# Patient Record
Sex: Female | Born: 1980 | Race: Black or African American | Hispanic: No | Marital: Single | Smoking: Former smoker
Health system: Southern US, Community
[De-identification: ages and names within clinical notes are randomized; demographics above are authoritative.]

## PROBLEM LIST (undated history)

## (undated) DIAGNOSIS — J302 Other seasonal allergic rhinitis: Secondary | ICD-10-CM

## (undated) DIAGNOSIS — Z8709 Personal history of other diseases of the respiratory system: Secondary | ICD-10-CM

## (undated) HISTORY — PX: SHOULDER SURGERY: SHX246

## (undated) HISTORY — DX: Other seasonal allergic rhinitis: J30.2

## (undated) HISTORY — DX: Personal history of other diseases of the respiratory system: Z87.09

---
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
## 1999-09-07 ENCOUNTER — Emergency Department (HOSPITAL_COMMUNITY): Admission: EM | Admit: 1999-09-07 | Discharge: 1999-09-07 | Payer: Self-pay | Admitting: Emergency Medicine

## 2002-07-17 ENCOUNTER — Emergency Department (HOSPITAL_COMMUNITY): Admission: EM | Admit: 2002-07-17 | Discharge: 2002-07-17 | Payer: Self-pay | Admitting: Emergency Medicine

## 2002-08-17 ENCOUNTER — Inpatient Hospital Stay (HOSPITAL_COMMUNITY): Admission: AD | Admit: 2002-08-17 | Discharge: 2002-08-17 | Payer: Self-pay | Admitting: *Deleted

## 2003-05-16 ENCOUNTER — Emergency Department (HOSPITAL_COMMUNITY): Admission: EM | Admit: 2003-05-16 | Discharge: 2003-05-16 | Payer: Self-pay | Admitting: Emergency Medicine

## 2008-05-05 ENCOUNTER — Emergency Department (HOSPITAL_COMMUNITY): Admission: EM | Admit: 2008-05-05 | Discharge: 2008-05-05 | Payer: Self-pay | Admitting: Family Medicine

## 2008-05-06 ENCOUNTER — Emergency Department (HOSPITAL_COMMUNITY): Admission: EM | Admit: 2008-05-06 | Discharge: 2008-05-06 | Payer: Self-pay | Admitting: Emergency Medicine

## 2008-05-07 ENCOUNTER — Emergency Department (HOSPITAL_COMMUNITY): Admission: EM | Admit: 2008-05-07 | Discharge: 2008-05-07 | Payer: Self-pay | Admitting: Emergency Medicine

## 2008-10-31 ENCOUNTER — Emergency Department (HOSPITAL_COMMUNITY): Admission: EM | Admit: 2008-10-31 | Discharge: 2008-10-31 | Payer: Self-pay | Admitting: Emergency Medicine

## 2011-05-06 ENCOUNTER — Emergency Department (HOSPITAL_COMMUNITY)
Admission: EM | Admit: 2011-05-06 | Discharge: 2011-05-06 | Disposition: A | Discharge status: Home / Self Care | Payer: Medicaid Other | Attending: Emergency Medicine | Admitting: Emergency Medicine

## 2011-05-06 DIAGNOSIS — J069 Acute upper respiratory infection, unspecified: Secondary | ICD-10-CM | POA: Insufficient documentation

## 2011-05-06 DIAGNOSIS — F17200 Nicotine dependence, unspecified, uncomplicated: Secondary | ICD-10-CM | POA: Insufficient documentation

## 2011-05-06 DIAGNOSIS — R059 Cough, unspecified: Secondary | ICD-10-CM | POA: Insufficient documentation

## 2011-05-06 DIAGNOSIS — IMO0001 Reserved for inherently not codable concepts without codable children: Secondary | ICD-10-CM | POA: Insufficient documentation

## 2011-05-06 DIAGNOSIS — R05 Cough: Secondary | ICD-10-CM | POA: Insufficient documentation

## 2011-06-01 LAB — POCT URINALYSIS DIP (DEVICE)
Glucose, UA: NEGATIVE
Operator id: 208841
Specific Gravity, Urine: >=1.03
Urobilinogen, UA: 4 — ABNORMAL HIGH

## 2011-06-01 LAB — URINALYSIS, MICROSCOPIC ONLY
Glucose, UA: NEGATIVE
Protein, ur: 100 — AB
Specific Gravity, Urine: 1.039 — ABNORMAL HIGH
Urobilinogen, UA: 2 — ABNORMAL HIGH

## 2011-06-01 LAB — GC/CHLAMYDIA PROBE AMP, GENITAL
Chlamydia, DNA Probe: NEGATIVE
GC Probe Amp, Genital: NEGATIVE

## 2011-06-01 LAB — URINE CULTURE: Colony Count: 40000

## 2011-06-01 LAB — HERPES SIMPLEX VIRUS CULTURE

## 2012-04-20 ENCOUNTER — Emergency Department (HOSPITAL_COMMUNITY)
Admission: EM | Admit: 2012-04-20 | Discharge: 2012-04-20 | Disposition: A | Discharge status: Home / Self Care | Payer: Managed Care, Other (non HMO) | Attending: Emergency Medicine | Admitting: Emergency Medicine

## 2012-04-20 ENCOUNTER — Emergency Department (HOSPITAL_COMMUNITY): Payer: Managed Care, Other (non HMO)

## 2012-04-20 ENCOUNTER — Encounter (HOSPITAL_COMMUNITY): Payer: Self-pay | Admitting: Emergency Medicine

## 2012-04-20 DIAGNOSIS — R0781 Pleurodynia: Secondary | ICD-10-CM

## 2012-04-20 DIAGNOSIS — F17200 Nicotine dependence, unspecified, uncomplicated: Secondary | ICD-10-CM | POA: Insufficient documentation

## 2012-04-20 DIAGNOSIS — R091 Pleurisy: Secondary | ICD-10-CM | POA: Insufficient documentation

## 2012-04-20 MED ORDER — NAPROXEN 500 MG PO TABS: 500.0000 mg | ORAL_TABLET | Freq: Two times a day (BID) | ORAL | Status: DC

## 2012-04-20 NOTE — ED Provider Notes (Signed)
Medical screening examination/treatment/procedure(s) were performed by non-physician practitioner and as supervising physician I was immediately available for consultation/collaboration.   Kimbely Whiteaker M Theophile Harvie, MD 04/20/12 2148 

## 2012-04-20 NOTE — ED Provider Notes (Signed)
History     CSN: [id number]  Arrival date & time 04/20/12  1159   None     Chief Complaint  Patient presents with  . Pleurisy    (Consider location/radiation/quality/duration/timing/severity/associated sxs/prior treatment) The history is provided by the patient and medical records.   Cassandra Mcmillan is a 31 y.o. female presents to the emergency department complaining of left-sided chest pain.  The onset of the symptoms was  gradual starting 3 days ago.  The patient has associated cough.  The symptoms have been  persistent, gradually worsened.  It may, coughing, deep inspiration makes the symptoms worse and nothing makes symptoms better.  The patient denies nausea, vomiting, diaphoresis, shortness of breath, syncope, near syncope.  She states she was sick approximately 2 weeks ago with a cold, cough, congestion which persisted for greater than one week. 3 days ago she began to have pain in the lower left ribs.  She denies trauma, injury.  She states she is coughing up clear phlegm.  The pain increases with cough, deep inspiration, movement, palpation.    She has no family history of early cardiac disease or sudden death.  She is a smoker.  He also works in a Development worker, community.  She admits that there is some chance she could have pulled a muscle.  Pain is described as sharp, stabbing, burning, rated at a 2/10 and does not radiate.  It is able to be localized/pinpointed with one finger.  It is most intensely caused by coughing and deep breathing.   The patient has medical history significant for: History reviewed. No pertinent past medical history.   History reviewed. No pertinent past medical history.  History reviewed. No pertinent past surgical history.  History reviewed. No pertinent family history.  History  Substance Use Topics  . Smoking status: Current Everyday Smoker -- 0.5 packs/day  . Smokeless tobacco: Not on file  . Alcohol Use: No    OB History    Grav Para  Term Preterm Abortions TAB SAB Ect Mult Living                  Review of Systems  Constitutional: Negative for fever, activity change and fatigue.  HENT: Negative for sore throat, neck pain and neck stiffness.   Respiratory: Positive for cough. Negative for choking, chest tightness, shortness of breath and wheezing.   Cardiovascular: Positive for chest pain. Negative for palpitations and leg swelling.  Gastrointestinal: Negative for nausea, vomiting, abdominal pain, diarrhea and constipation.  Skin: Negative for rash.  Neurological: Negative for light-headedness and headaches.    Allergies  Review of patient's allergies indicates no known allergies.  Home Medications  No current outpatient prescriptions on file.  BP 105/65  Pulse 68  Temp 98.1 F (36.7 C) (Oral)  Resp 20  Ht 5\' 2"  (1.575 m)  Wt 125 lb (56.7 kg)  BMI 22.86 kg/m2  SpO2 100%  LMP 04/03/2012  Physical Exam  Nursing note and vitals reviewed. Constitutional: She appears well-developed and well-nourished. No distress.  HENT:  Head: Normocephalic and atraumatic.  Mouth/Throat: Oropharynx is clear and moist. No oropharyngeal exudate.  Eyes: Conjunctivae are normal. No scleral icterus.  Neck: Normal range of motion. Neck supple.  Cardiovascular: Normal rate, regular rhythm, normal heart sounds and intact distal pulses.  Exam reveals no gallop and no friction rub.   No murmur heard. Pulmonary/Chest: Effort normal and breath sounds normal. No respiratory distress. She has no wheezes. She has no rales. She  exhibits tenderness (with palpation).  Abdominal: Soft. Bowel sounds are normal. She exhibits no mass. There is no tenderness. There is no rebound and no guarding.  Musculoskeletal: Normal range of motion. She exhibits tenderness (to the anterior chest wall.). She exhibits no edema.       Pain located in the anterior chest around the left lower sternal border and the ninth rib.      Lymphadenopathy:    She has  no cervical adenopathy.  Neurological: She is alert. She exhibits normal muscle tone. Coordination normal.       Speech is clear and goal oriented Moves extremities without ataxia  Skin: Skin is warm and dry. No rash noted. She is not diaphoretic. No erythema.       There is no ecchymosis or erythema.  There is no rash or broken skin.    Psychiatric: She has a normal mood and affect.    ED Course  Procedures (including critical care time)  Labs Reviewed - No data to display Dg Chest 2 View  04/20/2012  *RADIOLOGY REPORT*  Clinical Data: Cough with left rib pain.  No known injury.  CHEST - 2 VIEW  Comparison: None.  Findings: The heart size and mediastinal contours are normal. The lungs are clear. There is no pleural effusion or pneumothorax. No acute osseous findings are identified.  IMPRESSION: No active cardiopulmonary process.  No rib abnormalities identified.   Original Report Authenticated By: Gerrianne Scale, M.D.      1. Pleurisy without effusion   2. Pleuritic chest pain     MDM  [name] presents with left-sided chest pain/rib pain.  She is low risk for PE based on the Wells criteria.  She has PERC negative.  She is young, healthy, alert and oriented, interactive, nontoxic-appearing.  Pain changes with movement and deep inspiration.   Based on history and physical exam I believe this is pleurisy secondary to a viral infection a few weeks ago.  Her chest x-ray is negative for active cardiopulmonary diseases or rib abnormalities, there is no effusion present.  She's had no trauma to suggest rib fracture. She does not have shortness of breath and I do not believe that this is a pulmonary embolism or acute coronary syndrome.  Discussed this with the patient along with treatment with NSAIDs.  I have also discussed reasons to return immediately to the ER.  Patient states understanding.    1. Medications: Ibuprofen 2. Treatment: Rest, warm compress if that helps, Take ibuprofen  as needed  3. Follow Up: Primary care or emergency department if symptoms persist. Return immediately to the emergency department if you develop shortness of breath associated with chest pain, nausea, lightheadedness or other symptoms.        Dahlia Client Josmar Messimer, PA-C 04/20/12 1435

## 2012-04-20 NOTE — ED Notes (Signed)
Patient states she had a cold for about a week and a half and for the last three days it hurts (mid-substernally) when she coughs or takes a deep breath

## 2013-02-16 ENCOUNTER — Encounter (HOSPITAL_COMMUNITY): Payer: Self-pay | Admitting: Emergency Medicine

## 2013-02-16 ENCOUNTER — Emergency Department (HOSPITAL_COMMUNITY)
Admission: EM | Admit: 2013-02-16 | Discharge: 2013-02-16 | Disposition: A | Discharge status: Home / Self Care | Payer: Managed Care, Other (non HMO) | Attending: Emergency Medicine | Admitting: Emergency Medicine

## 2013-02-16 DIAGNOSIS — J020 Streptococcal pharyngitis: Secondary | ICD-10-CM

## 2013-02-16 DIAGNOSIS — F17200 Nicotine dependence, unspecified, uncomplicated: Secondary | ICD-10-CM | POA: Insufficient documentation

## 2013-02-16 LAB — RAPID STREP SCREEN (MED CTR MEBANE ONLY): Streptococcus, Group A Screen (Direct): NEGATIVE

## 2013-02-16 MED ORDER — PENICILLIN G BENZATHINE 1200000 UNIT/2ML IM SUSP
1.2000 10*6.[IU] | Freq: Once | INTRAMUSCULAR | Status: AC
  Administered 2013-02-16: 1.2 10*6.[IU] via INTRAMUSCULAR
  Filled 2013-02-16: qty 2

## 2013-02-16 NOTE — ED Provider Notes (Signed)
History     CSN: [id number]  Arrival date & time 02/16/13  1023   First MD Initiated Contact with Patient 02/16/13 1056      Chief Complaint  Patient presents with  . Sore Throat    (Consider location/radiation/quality/duration/timing/severity/associated sxs/prior treatment) HPI Comments: Patient is a 32 year old female who presents with a 3 day history of sore throat. Patient reports gradual onset and progressively worsening sharp, severe throat pain. The pain is constant and made worse with swallowing. The pain is localized to the patient's throat and equal on both sides. Nothing alleviates the pain. The patient has not tried anything for symptom relief. Patient reports associated cervical adenopathy. Patient denies fever, cough, headache, visual changes, sinus congestion, difficulty breathing, chest pain, SOB, abdominal pain, NVD.     Patient is a 32 y.o. female presenting with pharyngitis.  Sore Throat Associated symptoms include a sore throat.    History reviewed. No pertinent past medical history.  History reviewed. No pertinent past surgical history.  No family history on file.  History  Substance Use Topics  . Smoking status: Current Every Day Smoker -- 0.50 packs/day  . Smokeless tobacco: Not on file  . Alcohol Use: No    OB History   Grav Para Term Preterm Abortions TAB SAB Ect Mult Living                  Review of Systems  HENT: Positive for sore throat.   All other systems reviewed and are negative.    Allergies  Review of patient's allergies indicates no known allergies.  Home Medications  No current outpatient prescriptions on file.  BP 100/65  Pulse 88  Temp(Src) 99.1 F (37.3 C) (Oral)  Resp 20  SpO2 100%  LMP 02/08/2013  Physical Exam  Nursing note and vitals reviewed. Constitutional: She appears well-developed and well-nourished. No distress.  HENT:  Head: Normocephalic and atraumatic.  Mouth/Throat: Oropharyngeal exudate present.   Bilateral tonsillar erythema and exudate noted. No uvula deviation.   Eyes: Conjunctivae and EOM are normal.  Neck: Normal range of motion.  Cardiovascular: Normal rate and regular rhythm.  Exam reveals no gallop and no friction rub.   No murmur heard. Pulmonary/Chest: Effort normal and breath sounds normal. She has no wheezes. She has no rales. She exhibits no tenderness.  Abdominal: Soft. There is no tenderness.  Musculoskeletal: Normal range of motion.  Lymphadenopathy:    She has cervical adenopathy.  Neurological: She is alert.  Speech is goal-oriented. Moves limbs without ataxia.   Skin: Skin is warm and dry.  Psychiatric: She has a normal mood and affect. Her behavior is normal.    ED Course  Procedures (including critical care time)  Labs Reviewed  RAPID STREP SCREEN  CULTURE, GROUP A STREP   No results found.   1. Strep pharyngitis       MDM  11:13 AM Rapid strep pending. Vitals stable and patient afebrile.   12:52 PM Rapid strep negative but patient will be treated based on clinical impression. Patient will have IM pen G. Vitals stable and patient afebrile. Patient will be discharged without further evaluation. Patient instructed to return with worsening or concerning symptoms.        Cassandra Beck, PA-C 02/16/13 1312

## 2013-02-16 NOTE — ED Provider Notes (Signed)
Medical screening examination/treatment/procedure(s) were performed by non-physician practitioner and as supervising physician I was immediately available for consultation/collaboration.  Juliet Rude. Rubin Payor, MD 02/16/13 1549

## 2013-02-16 NOTE — ED Notes (Signed)
Pt from home reports that she has had sore throat and sore neck glands x3 days. Pt denies fever, body aches, cough. Pt adds that she ate and drank after someone that was dx with Mono. Pt is A&Ox4 and in NAD.

## 2013-02-18 LAB — CULTURE, GROUP A STREP

## 2013-02-19 ENCOUNTER — Emergency Department (HOSPITAL_COMMUNITY)
Admission: EM | Admit: 2013-02-19 | Discharge: 2013-02-19 | Disposition: A | Discharge status: Home / Self Care | Payer: Managed Care, Other (non HMO) | Attending: Emergency Medicine | Admitting: Emergency Medicine

## 2013-02-19 ENCOUNTER — Encounter (HOSPITAL_COMMUNITY): Payer: Self-pay | Admitting: Emergency Medicine

## 2013-02-19 DIAGNOSIS — R110 Nausea: Secondary | ICD-10-CM | POA: Insufficient documentation

## 2013-02-19 DIAGNOSIS — R599 Enlarged lymph nodes, unspecified: Secondary | ICD-10-CM | POA: Insufficient documentation

## 2013-02-19 DIAGNOSIS — F17200 Nicotine dependence, unspecified, uncomplicated: Secondary | ICD-10-CM | POA: Insufficient documentation

## 2013-02-19 DIAGNOSIS — R51 Headache: Secondary | ICD-10-CM | POA: Insufficient documentation

## 2013-02-19 DIAGNOSIS — J029 Acute pharyngitis, unspecified: Secondary | ICD-10-CM

## 2013-02-19 DIAGNOSIS — M542 Cervicalgia: Secondary | ICD-10-CM | POA: Insufficient documentation

## 2013-02-19 MED ORDER — KETOROLAC TROMETHAMINE 60 MG/2ML IM SOLN
60.0000 mg | Freq: Once | INTRAMUSCULAR | Status: AC
  Administered 2013-02-19: 60 mg via INTRAMUSCULAR
  Filled 2013-02-19: qty 2

## 2013-02-19 MED ORDER — DEXAMETHASONE SODIUM PHOSPHATE 10 MG/ML IJ SOLN
10.0000 mg | Freq: Once | INTRAMUSCULAR | Status: AC
  Administered 2013-02-19: 10 mg via INTRAMUSCULAR
  Filled 2013-02-19: qty 1

## 2013-02-19 MED ORDER — OXYCODONE-ACETAMINOPHEN 5-325 MG/5ML PO SOLN: 5.0000 mL | ORAL | Status: DC | PRN

## 2013-02-19 NOTE — ED Provider Notes (Signed)
History  This chart was scribed for non-physician practitioner working with Flint Melter, MD by Greggory Stallion, ED scribe. This patient was seen in room WTR5/WTR5 and the patient's care was started at 3:02 PM.  CSN: [id number] Arrival date & time 02/19/13  1421    Chief Complaint  Patient presents with  . Sore Throat  . Headache  . Neck Pain    The history is provided by the patient. No language interpreter was used.    HPI Comments: Cassandra Mcmillan is a 32 y.o. female who presents to the Emergency Department complaining of gradual onset, gradually worsening sore throat that started 5 days ago. She states she has associated neck pain and migraine that started two days ago. Pt states there is increased pain with swallowing. Pt states she was here Sunday for the sore throat and was given a shot of penicillin with no relief. Pt states she was exposed to mono about one week ago. She states she has occasional nausea. She states she has had a slight fever of 99. Pt states she has taken aspirin with no relief. She states she has trouble breathing at night. Pt denies emesis.   No past medical history on file. No past surgical history on file. No family history on file. History  Substance Use Topics  . Smoking status: Current Every Day Smoker -- 0.50 packs/day  . Smokeless tobacco: Not on file  . Alcohol Use: No   OB History   Grav Para Term Preterm Abortions TAB SAB Ect Mult Living                 Review of Systems  HENT: Positive for sore throat and neck pain.   Gastrointestinal: Positive for nausea.  Neurological: Positive for headaches.  All other systems reviewed and are negative.    Allergies  Review of patient's allergies indicates no known allergies.  Home Medications   Current Outpatient Rx  Name  Route  Sig  Dispense  Refill  . OVER THE COUNTER MEDICATION   Oral   Take 1 tablet by mouth 2 (two) times daily as needed (for allergy symptoms). OTC Allergy Relief  pill.          BP 108/71  Pulse 100  Temp(Src) 99.2 F (37.3 C) (Oral)  Resp 14  Ht 5\' 2"  (1.575 m)  Wt 130 lb (58.968 kg)  BMI 23.77 kg/m2  SpO2 100%  LMP 02/08/2013  Physical Exam  Nursing note and vitals reviewed. Constitutional: She is oriented to person, place, and time. She appears well-developed and well-nourished. No distress.  HENT:  Head: Normocephalic and atraumatic. No trismus in the jaw.  Right Ear: External ear normal.  Left Ear: External ear normal.  Nose: Nose normal.  Mouth/Throat: Uvula is midline, oropharynx is clear and moist and mucous membranes are normal.  Bilaterally tonsillar enlargement with exudate. No submental edema, tongue elevation, or trismus.  Eyes: Conjunctivae are normal.  Neck: Normal range of motion.  Cardiovascular: Normal rate, regular rhythm and normal heart sounds.   Pulmonary/Chest: Effort normal and breath sounds normal. No stridor. No respiratory distress. She has no wheezes. She has no rales.  Abdominal: Soft. She exhibits no distension. There is no hepatosplenomegaly. There is no tenderness.  Musculoskeletal: Normal range of motion.  Lymphadenopathy:    She has cervical adenopathy.       Right cervical: Superficial cervical adenopathy present.       Left cervical: Superficial cervical adenopathy present.  Neurological: She  is alert and oriented to person, place, and time. She has normal strength.  Skin: Skin is warm and dry. She is not diaphoretic. No erythema.  Psychiatric: She has a normal mood and affect. Her behavior is normal.    ED Course  Procedures (including critical care time)  DIAGNOSTIC STUDIES: Oxygen Saturation is 100% on RA, normal by my interpretation.    COORDINATION OF CARE: 3:31 PM-Discussed treatment plan with pt at bedside and pt agreed to plan.   Labs Reviewed - No data to display  No results found.  1. Pharyngitis     MDM  Patient presents today with worsening pharyngitis. Treated with  Bicillin after negative strep test 2 days ago. No improvement after bicillin and asa. Tonsils enlarged bilaterally, no uvula deviation, trismus, submental edema, tongue elevation. No concern for spread of infection to soft tissue. Discussed possibility of mono and reason to come back to ED. She was given toradol and decadron in ED today. Vital signs stable for discharge. Patient / Family / Caregiver informed of clinical course, understand medical decision-making process, and agree with plan.   Medications  dexamethasone (DECADRON) injection 10 mg (10 mg Intramuscular Given 02/19/13 1549)  ketorolac (TORADOL) injection 60 mg (60 mg Intramuscular Given 02/19/13 1549)         I personally performed the services described in this documentation, which was scribed in my presence. The recorded information has been reviewed and is accurate.    Mora Bellman, PA-C 02/19/13 1645

## 2013-02-19 NOTE — ED Notes (Signed)
Pt reports sore throat x 5 days, increased pain when swallowing. C/o neck pain x2 days. Migraine x 2 days. Denies vomiting. Occasional. nausea

## 2013-02-20 NOTE — ED Provider Notes (Signed)
Medical screening examination/treatment/procedure(s) were performed by non-physician practitioner and as supervising physician I was immediately available for consultation/collaboration.  Flint Melter, MD 02/20/13 1327

## 2013-05-17 ENCOUNTER — Emergency Department (HOSPITAL_COMMUNITY)
Admission: EM | Admit: 2013-05-17 | Discharge: 2013-05-17 | Disposition: A | Discharge status: Home / Self Care | Payer: Managed Care, Other (non HMO) | Attending: Emergency Medicine | Admitting: Emergency Medicine

## 2013-05-17 ENCOUNTER — Emergency Department (HOSPITAL_COMMUNITY): Payer: Managed Care, Other (non HMO)

## 2013-05-17 ENCOUNTER — Encounter (HOSPITAL_COMMUNITY): Payer: Self-pay | Admitting: *Deleted

## 2013-05-17 DIAGNOSIS — J40 Bronchitis, not specified as acute or chronic: Secondary | ICD-10-CM | POA: Insufficient documentation

## 2013-05-17 DIAGNOSIS — F17200 Nicotine dependence, unspecified, uncomplicated: Secondary | ICD-10-CM | POA: Insufficient documentation

## 2013-05-17 DIAGNOSIS — J3489 Other specified disorders of nose and nasal sinuses: Secondary | ICD-10-CM | POA: Insufficient documentation

## 2013-05-17 MED ORDER — ALBUTEROL SULFATE HFA 108 (90 BASE) MCG/ACT IN AERS
1.0000 | INHALATION_SPRAY | RESPIRATORY_TRACT | Status: DC | PRN
  Administered 2013-05-17: 2 via RESPIRATORY_TRACT
  Filled 2013-05-17: qty 13.4

## 2013-05-17 NOTE — ED Notes (Signed)
Cough/congestion/chest tightness/itchy throat; x 3 days

## 2013-05-17 NOTE — ED Provider Notes (Signed)
CSN: [id number]     Arrival date & time 05/17/13  7829 History   First MD Initiated Contact with Patient 05/17/13 0703     Chief Complaint  Patient presents with  . Cough   (Consider location/radiation/quality/duration/timing/severity/associated sxs/prior Treatment) Patient is a 32 y.o. female presenting with cough. The history is provided by the patient.  Cough Cough characteristics:  Productive Sputum characteristics:  Yellow Severity:  Mild Onset quality:  Gradual Duration:  3 days Timing:  Constant Progression:  Unchanged Chronicity:  New Smoker: yes   Context: upper respiratory infection   Relieved by:  Nothing Worsened by:  Nothing tried Ineffective treatments:  Rest Associated symptoms: chest pain, rhinorrhea, shortness of breath, sinus congestion and wheezing   Associated symptoms: no fever and no sore throat   Associated symptoms comment:  Chest tightness worse at night Risk factors: no recent infection and no recent travel     History reviewed. No pertinent past medical history. History reviewed. No pertinent past surgical history. No family history on file. History  Substance Use Topics  . Smoking status: Current Every Day Smoker -- 0.50 packs/day  . Smokeless tobacco: Not on file  . Alcohol Use: No   OB History   Grav Para Term Preterm Abortions TAB SAB Ect Mult Living                 Review of Systems  Constitutional: Negative for fever.  HENT: Positive for rhinorrhea. Negative for sore throat.   Respiratory: Positive for cough, shortness of breath and wheezing.   Cardiovascular: Positive for chest pain.  All other systems reviewed and are negative.    Allergies  Review of patient's allergies indicates no known allergies.  Home Medications  No current outpatient prescriptions on file. BP 120/89  Pulse 69  Temp(Src) 98 F (36.7 C) (Oral)  Resp 16  SpO2 100%  LMP 05/03/2013 Physical Exam  Nursing note and vitals reviewed. Constitutional:  She is oriented to person, place, and time. She appears well-developed and well-nourished. No distress.  HENT:  Head: Normocephalic and atraumatic.  Right Ear: Tympanic membrane and ear canal normal.  Left Ear: Tympanic membrane and ear canal normal.  Nose: Mucosal edema and rhinorrhea present.  Mouth/Throat: Posterior oropharyngeal erythema present. No oropharyngeal exudate or posterior oropharyngeal edema.  Eyes: Conjunctivae and EOM are normal. Pupils are equal, round, and reactive to light.  Neck: Normal range of motion. Neck supple.  Cardiovascular: Normal rate, regular rhythm and intact distal pulses.   No murmur heard. Pulmonary/Chest: Effort normal and breath sounds normal. No respiratory distress. She has no wheezes. She has no rales. She exhibits no tenderness.  Abdominal: Soft. She exhibits no distension. There is no tenderness. There is no rebound and no guarding.  Musculoskeletal: Normal range of motion. She exhibits no edema and no tenderness.  Neurological: She is alert and oriented to person, place, and time.  Skin: Skin is warm and dry. No rash noted. No erythema.  Psychiatric: She has a normal mood and affect. Her behavior is normal.    ED Course  Procedures (including critical care time) Labs Review Labs Reviewed - No data to display Imaging Review Dg Chest 2 View  05/17/2013   *RADIOLOGY REPORT*  Clinical Data: Cough, chest tightness  CHEST - 2 VIEW  Comparison: Chest radiograph 04/20/2012  Findings: Normal cardiac and mediastinal contours.  No consolidative pulmonary opacities.  No pleural effusion or pneumothorax.  Regional skeleton is unremarkable.  IMPRESSION: No acute cardiopulmonary process.  Original Report Authenticated By: Annia Belt, M.D    MDM   1. Bronchitis     Pt with symptoms consistent with viral URI/bronchitis.  Well appearing here.  No signs of breathing difficulty but does c/o of mild SOB and chest tightness.  Pt is a smoker but no hx of  asthma. No signs of pharyngitis, otitis or abnormal abdominal findings.   CXR wnl and pt to return with any further problems.     Gwyneth Sprout, MD 05/17/13 [phone]

## 2013-09-21 IMAGING — CR DG CHEST 2V
2 series · 2 of 2 positions shown · non-contrast
Comparison: Chest radiograph 04/20/2012

CLINICAL DATA: Cough, chest tightness

CHEST - 2 VIEW

[w chest pa]
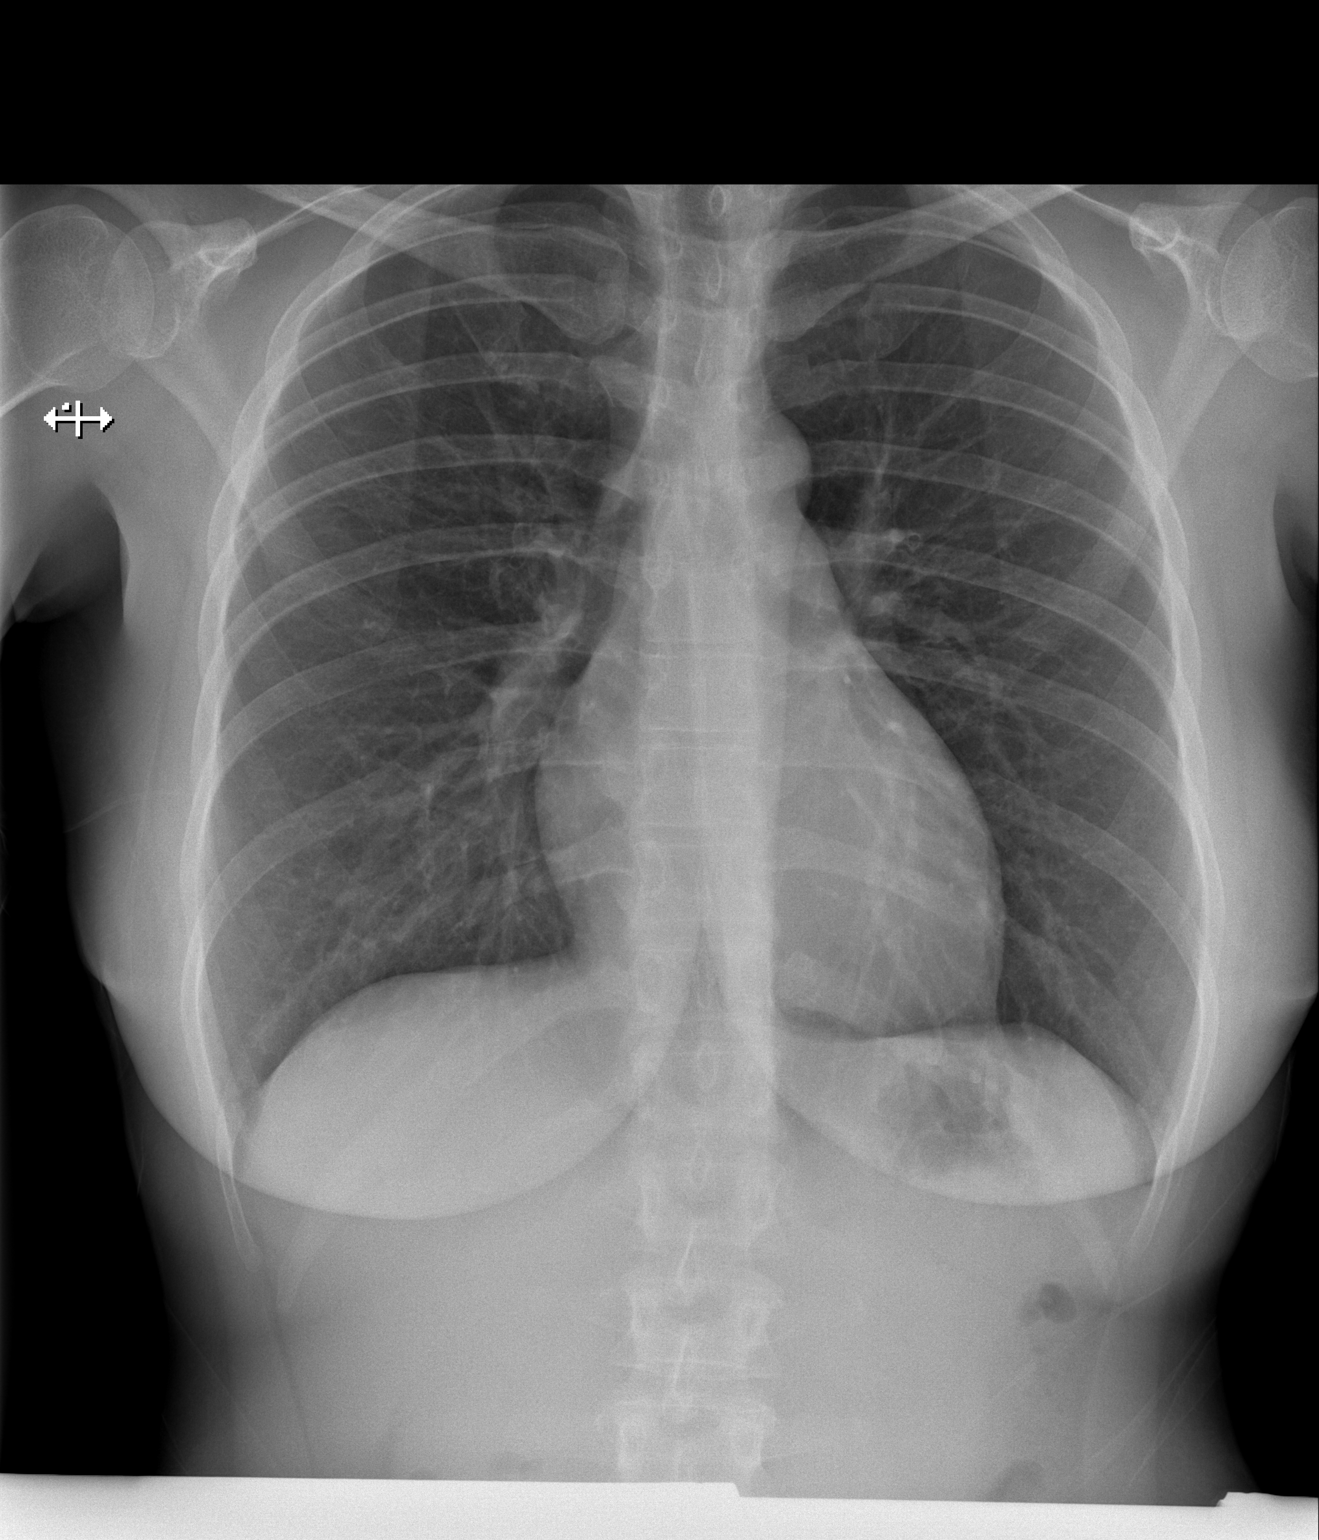

[w chest lat]
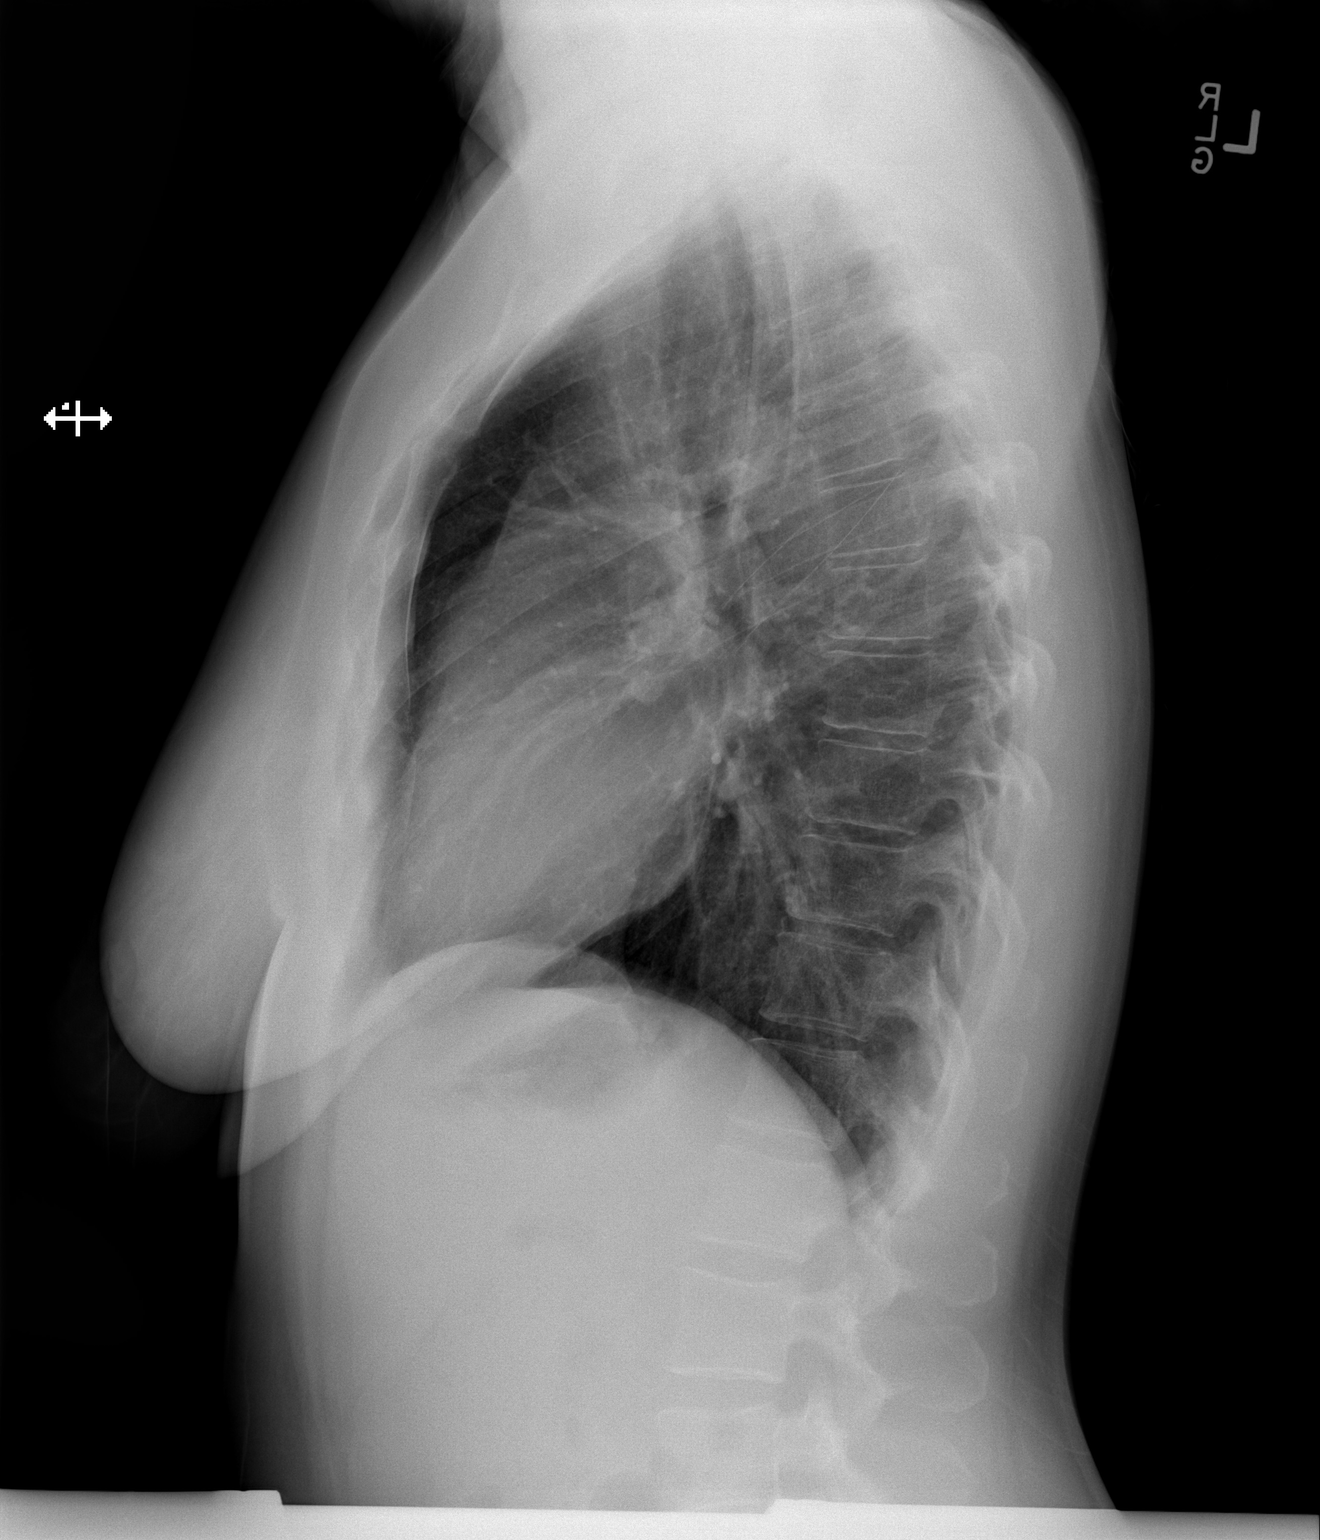

[2 of 2 positions shown; findings below may reference images not displayed]

FINDINGS: Normal cardiac and mediastinal contours.  No
consolidative pulmonary opacities.  No pleural effusion or
pneumothorax.  Regional skeleton is unremarkable.
IMPRESSION: No acute cardiopulmonary process.

## 2013-10-27 ENCOUNTER — Emergency Department (INDEPENDENT_AMBULATORY_CARE_PROVIDER_SITE_OTHER)
Admission: EM | Admit: 2013-10-27 | Discharge: 2013-10-27 | Disposition: A | Discharge status: Home / Self Care | Payer: Managed Care, Other (non HMO) | Source: Home / Self Care | Attending: Family Medicine | Admitting: Family Medicine

## 2013-10-27 ENCOUNTER — Encounter (HOSPITAL_COMMUNITY): Payer: Self-pay | Admitting: Emergency Medicine

## 2013-10-27 DIAGNOSIS — M25511 Pain in right shoulder: Secondary | ICD-10-CM

## 2013-10-27 DIAGNOSIS — M25519 Pain in unspecified shoulder: Secondary | ICD-10-CM

## 2013-10-27 DIAGNOSIS — M25512 Pain in left shoulder: Principal | ICD-10-CM

## 2013-10-27 MED ORDER — TRAMADOL HCL 50 MG PO TABS: 50.0000 mg | ORAL_TABLET | Freq: Four times a day (QID) | ORAL | Status: DC | PRN

## 2013-10-27 MED ORDER — PREDNISONE 10 MG PO KIT: PACK | ORAL | Status: DC

## 2013-10-27 NOTE — ED Notes (Signed)
Pt c/o bilateral shoulder pain x 3 months. Pt has used muscle rub, soak and aspirin with no relief. Pt works lifting heavy boxes. Pt is alert and oriented, no acute distress.

## 2013-10-27 NOTE — ED Provider Notes (Signed)
Cassandra Mcmillan is a 33 y.o. female who presents to Urgent Care today for shoulder pain. Patient has had bilateral shoulder pain for 3 months worsening recently. She denies any injury. She notes pain in the anterior lateral upper arm worse with overhead activity and at night. She denies any significant radiating pain weakness or numbness. She has tried ibuprofen which has not been very effective. She denies any neck pain. 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     History reviewed. No pertinent past medical history. History  Substance Use Topics  . Smoking status: Current Every Day Smoker -- 0.50 packs/day    Types: Cigarettes  . Smokeless tobacco: Not on file  . Alcohol Use: No   ROS as above no fevers chills nausea vomiting diarrhea chest pain or palpitations. Medications: No current facility-administered medications for this encounter.   Current Outpatient Prescriptions  Medication Sig Dispense Refill  . PredniSONE 10 MG KIT 12 day dose pack po  1 kit  0  . traMADol (ULTRAM) 50 MG tablet Take 1 tablet (50 mg total) by mouth every 6 (six) hours as needed.  15 tablet  0    Exam:  BP 118/73  Pulse 77  Temp(Src) 98.8 F (37.1 C) (Oral)  Resp 16  SpO2 100%  LMP 08/30/2013 Gen: Well NAD HEENT: EOMI,  MMM Lungs: Normal work of breathing. CTABL Heart: RRR no MRG Abd: NABS, Soft. NT, ND Exts: Brisk capillary refill, warm and well perfused.  Shoulders: Bilateral are normal appearing. Nontender. Full passive range of motion. Activities limited to about 120 abduction. Painful ARC with no drop arm sign bilaterally. Positive impingement testing bilaterally. Strength is intact bilaterally Capillary refill sensation and grip strength are intact bilateral upper extremities  No results found for this or any previous visit (from the past 24 hour(s)). No results found.  Assessment and Plan: 33 y.o. female with shoulder impingement or  bursitis bilaterally. Doubtful for rotator cuff tear. Plan to treat with prednisone and tramadol. Additionally will use home exercise program. Followup with Dr. Alfonso Ramus at [place] if not improving. Work note provided.  Discussed warning signs or symptoms. Please see discharge instructions. Patient expresses understanding.    Gregor Hams, MD 10/27/13 [phone]

## 2013-10-27 NOTE — Discharge Instructions (Signed)
Thank you for coming in today. Take the prednisone daily for 12 days.  Use the tramadol for severe pain.  Follow up with Dr. Farris HasKramer in about 3-4 weeks. Follow up with him sooner if worsening.

## 2014-06-24 ENCOUNTER — Other Ambulatory Visit (HOSPITAL_COMMUNITY)
Admission: RE | Admit: 2014-06-24 | Discharge: 2014-06-24 | Disposition: A | Discharge status: Home / Self Care | Payer: Managed Care, Other (non HMO) | Source: Ambulatory Visit | Attending: Women's Health | Admitting: Women's Health

## 2014-06-24 ENCOUNTER — Ambulatory Visit (INDEPENDENT_AMBULATORY_CARE_PROVIDER_SITE_OTHER): Payer: PRIVATE HEALTH INSURANCE | Admitting: Women's Health

## 2014-06-24 ENCOUNTER — Encounter: Payer: Self-pay | Admitting: Women's Health

## 2014-06-24 VITALS — BP 120/82 | Ht 62.0 in | Wt 180.0 lb

## 2014-06-24 DIAGNOSIS — Z1151 Encounter for screening for human papillomavirus (HPV): Secondary | ICD-10-CM | POA: Diagnosis present

## 2014-06-24 DIAGNOSIS — N898 Other specified noninflammatory disorders of vagina: Secondary | ICD-10-CM

## 2014-06-24 DIAGNOSIS — Z30011 Encounter for initial prescription of contraceptive pills: Secondary | ICD-10-CM

## 2014-06-24 DIAGNOSIS — A599 Trichomoniasis, unspecified: Secondary | ICD-10-CM

## 2014-06-24 DIAGNOSIS — Z01419 Encounter for gynecological examination (general) (routine) without abnormal findings: Secondary | ICD-10-CM | POA: Insufficient documentation

## 2014-06-24 DIAGNOSIS — Z113 Encounter for screening for infections with a predominantly sexual mode of transmission: Secondary | ICD-10-CM

## 2014-06-24 DIAGNOSIS — Z13220 Encounter for screening for lipoid disorders: Secondary | ICD-10-CM

## 2014-06-24 DIAGNOSIS — Z833 Family history of diabetes mellitus: Secondary | ICD-10-CM

## 2014-06-24 LAB — CBC WITH DIFFERENTIAL/PLATELET
Basophils Absolute: 0 10*3/uL (ref 0.0–0.1)
Basophils Relative: 0 % (ref 0–1)
EOS PCT: 1 % (ref 0–5)
Eosinophils Absolute: 0 10*3/uL (ref 0.0–0.7)
HEMATOCRIT: 39.1 % (ref 36.0–46.0)
HEMOGLOBIN: 13.1 g/dL (ref 12.0–15.0)
LYMPHS ABS: 2.2 10*3/uL (ref 0.7–4.0)
LYMPHS PCT: 45 % (ref 12–46)
MCH: 30.8 pg (ref 26.0–34.0)
MCHC: 33.5 g/dL (ref 30.0–36.0)
MCV: 91.8 fL (ref 78.0–100.0)
MONO ABS: 0.5 10*3/uL (ref 0.1–1.0)
Monocytes Relative: 11 % (ref 3–12)
NEUTROS ABS: 2.1 10*3/uL (ref 1.7–7.7)
Neutrophils Relative %: 43 % (ref 43–77)
Platelets: 283 10*3/uL (ref 150–400)
RBC: 4.26 MIL/uL (ref 3.87–5.11)
RDW: 13.1 % (ref 11.5–15.5)
WBC: 4.8 10*3/uL (ref 4.0–10.5)

## 2014-06-24 LAB — HEPATITIS B SURFACE ANTIGEN: HEP B S AG: NEGATIVE

## 2014-06-24 LAB — WET PREP FOR TRICH, YEAST, CLUE
Clue Cells Wet Prep HPF POC: NONE SEEN
YEAST WET PREP: NONE SEEN

## 2014-06-24 LAB — LIPID PANEL
CHOLESTEROL: 156 mg/dL (ref 0–200)
HDL: 51 mg/dL (ref >39)
LDL CALC: 95 mg/dL (ref 0–99)
TRIGLYCERIDES: 50 mg/dL (ref <150)
Total CHOL/HDL Ratio: 3.1 Ratio
VLDL: 10 mg/dL (ref 0–40)

## 2014-06-24 LAB — GLUCOSE, RANDOM: Glucose, Bld: 78 mg/dL (ref 70–99)

## 2014-06-24 LAB — RPR

## 2014-06-24 LAB — HIV ANTIBODY (ROUTINE TESTING W REFLEX): HIV 1&2 Ab, 4th Generation: NONREACTIVE

## 2014-06-24 LAB — HEPATITIS C ANTIBODY: HCV AB: NEGATIVE

## 2014-06-24 MED ORDER — METRONIDAZOLE 500 MG PO TABS: ORAL_TABLET | ORAL | Status: DC

## 2014-06-24 MED ORDER — NORETHIN ACE-ETH ESTRAD-FE 1-20 MG-MCG PO TABS: 1.0000 | ORAL_TABLET | Freq: Every day | ORAL | Status: DC

## 2014-06-24 NOTE — Progress Notes (Signed)
Cassandra Mcmillan December 17, 1980 [id number]    History:    Presents for new patient annual exam. Monthly cycle. Single partner for <1 year with condom use 50% of time. Has tried BC pills in past-did not like side effects. Last gyn appointment 2 years ago, no problems. Reports normal PAP history. Complains of 3 day history of vaginal discharge and odor. Odor resolved yesterday. Denies fever/chills, urinary symptoms, or abdominal pain.   Past medical history, past surgical history, family history and social history were all reviewed and documented in the EPIC chart.  Works at [employer], but not currently working because of shoulder surgery bilaterally due to OA and torn ligament in 1 month.  Reports family as healthy.    ROS:  A  12 point ROS was performed and pertinent positives and negatives are included.  Exam:  Filed Vitals:   06/24/14 0952  BP: 120/82    General appearance:  Normal Thyroid:  Symmetrical, normal in size, without palpable masses or nodularity. Respiratory  Auscultation:  Clear without wheezing or rhonchi Cardiovascular  Auscultation:  Regular rate, without rubs, murmurs or gallops  Edema/varicosities:  Not grossly evident Abdominal  Soft,nontender, without masses, guarding or rebound.  Liver/spleen:  No organomegaly noted  Hernia:  None appreciated  Skin  Inspection:  Grossly normal   Breasts: Examined lying and sitting.     Right: Without masses, retractions, discharge or axillary adenopathy.     Left: Without masses, retractions, discharge or axillary adenopathy. Gentitourinary   Inguinal/mons:  Normal without inguinal adenopathy  External genitalia:  Normal  BUS/Urethra/Skene's glands:  Normal  Vagina:  Moderate white discharge, no odor noted, wet prep positive for Trichomonas, few  WBCs and many bacteria  Cervix:  Normal  Uterus:  normal in size, shape and contour.  Midline and mobile  Adnexa/parametria:     Rt: Without masses or tenderness.   Lt: Without  masses or tenderness.  Anus and perineum: Normal  Digital rectal exam: Normal sphincter tone without palpated masses or tenderness  Assessment/Plan:  33 y.o. BF G0P0 for annual exam.     STD screen Contraception Counseling Trichomoniasis  Plan: Discussed importance of SBEs, excersise and decreased calories for weight loss. UA, CBC, Lipid panel, glucose, PAP with HPV typing, GC/chlamydia, HIV, RVR, Hep B&C ordered. Flagyl 2g PO for both pt and partner, alcohol precautions reviewed.  Contraception counseling, declined will continue condoms.       Harrington ChallengerYOUNG,NANCY J Physicians Day Surgery CenterWHNP, 10:52 AM 06/24/2014

## 2014-06-24 NOTE — Patient Instructions (Signed)

## 2014-06-25 LAB — GC/CHLAMYDIA PROBE AMP
CT Probe RNA: NEGATIVE
GC Probe RNA: NEGATIVE

## 2014-06-26 LAB — CYTOLOGY - PAP

## 2014-12-04 ENCOUNTER — Encounter: Payer: Self-pay | Admitting: Medical

## 2014-12-04 ENCOUNTER — Ambulatory Visit (INDEPENDENT_AMBULATORY_CARE_PROVIDER_SITE_OTHER): Payer: PRIVATE HEALTH INSURANCE | Admitting: Medical

## 2014-12-04 VITALS — BP 120/80 | HR 68 | Temp 97.9°F | Resp 16 | Ht 62.0 in | Wt 179.0 lb

## 2014-12-04 DIAGNOSIS — J40 Bronchitis, not specified as acute or chronic: Secondary | ICD-10-CM | POA: Diagnosis not present

## 2014-12-04 DIAGNOSIS — R05 Cough: Secondary | ICD-10-CM

## 2014-12-04 DIAGNOSIS — R059 Cough, unspecified: Secondary | ICD-10-CM

## 2014-12-04 DIAGNOSIS — R062 Wheezing: Secondary | ICD-10-CM | POA: Diagnosis not present

## 2014-12-04 DIAGNOSIS — J309 Allergic rhinitis, unspecified: Secondary | ICD-10-CM | POA: Diagnosis not present

## 2014-12-04 MED ORDER — FEXOFENADINE HCL 180 MG PO TABS: 180.0000 mg | ORAL_TABLET | Freq: Every day | ORAL | Status: DC

## 2014-12-04 MED ORDER — ALBUTEROL SULFATE HFA 108 (90 BASE) MCG/ACT IN AERS: 1.0000 | INHALATION_SPRAY | Freq: Four times a day (QID) | RESPIRATORY_TRACT | Status: DC | PRN

## 2014-12-04 NOTE — Progress Notes (Addendum)
   Subjective:   Laverda PageLatoya S Rufus is a 34 y.o. female presenting on 12/04/2014 with Cough  She is a new patient today, was seeing urgent care prior. She does see gynecology, and just had Pap smear and evaluation fall 2015. She is here to discuss cough, wheezing, allergy symptoms. Over the last few years during the spring she gets allergy symptoms including runny nose, sneezing, cough, congestion. Around the same time she typically gets coughing spells wheezing and shortness of breath. She denies chest pain, swelling in legs, syncope. In the past she has been treated with albuterol inhaler and allergy pills. She denies specific history of asthma or COPD. She does have a short hair dog.  No other aggravating or relieving factors.  No other complaint.  Review of Systems ROS as in subjective      Objective:   Filed Vitals:   12/04/14 1135  BP: 120/80  Pulse: 68  Temp: 97.9 F (36.6 C)  Resp: 16    General appearance: alert, no distress, WD/WN, African American female HEENT: normocephalic, sclerae anicteric, TMs pearly, nares with turbinate edema, clear discharge , no erythema, pharynx normal Oral cavity: MMM, no lesions Neck: supple, no lymphadenopathy, no thyromegaly, no masses Heart: RRR, normal S1, S2, no murmurs Lungs: Few scattered wheezes, rhonchi, or rales Abdomen: +bs, soft, non tender, non distended, no masses, no hepatomegaly, no splenomegaly Pulses: 2+ symmetric, upper and lower extremities, normal cap refill Extremities-no edema     Assessment: Encounter Diagnoses  Name Primary?  . Allergic rhinitis, unspecified allergic rhinitis type Yes  . Wheezing   . Cough   . Bronchitis      Plan: Based on her symptoms, she likely has allergic asthma, and in the she has just used allergy pills and inhaler without much problem. We performed spirometry today which was actually normal. Begin Allegra daily at bedtime, albuterol when necessary, eat healthy, exercise, avoid  allergen triggers when possible. We discussed the possibility of using a daily preventative inhaler. She will call back in 1-2 weeks and if having to use the albuterol inhaler more than 3 times a week at night for more than 3 times a week in general may need to add on a controller medication such as Qvar which was discussed  Glee ArvinLatoya was seen today for cough.  Diagnoses and all orders for this visit:  Allergic rhinitis, unspecified allergic rhinitis type  Wheezing  Cough  Bronchitis Orders: -     Spirometry with Graph; Standing -     Spirometry with Graph  Other orders -     fexofenadine (ALLEGRA ALLERGY) 180 MG tablet; Take 1 tablet (180 mg total) by mouth daily. -     albuterol (PROVENTIL HFA;VENTOLIN HFA) 108 (90 BASE) MCG/ACT inhaler; Inhale 1-2 puffs into the lungs every 6 (six) hours as needed for wheezing or shortness of breath.   Return in about 1 month (around 01/03/2015).

## 2014-12-11 ENCOUNTER — Telehealth: Payer: Self-pay | Admitting: Medical

## 2014-12-11 NOTE — Telephone Encounter (Signed)
If she is not aware of her last one or if it has been more than 10 years since last Tdap, then that is fine.  She can come in for nurse visit for Tdap.

## 2014-12-11 NOTE — Telephone Encounter (Signed)
Pt called and stated she would like to get tdap. Please advise.

## 2014-12-12 NOTE — Telephone Encounter (Signed)
Left message for pt to call. Per Vincenza HewsShane ok for vaccine.

## 2014-12-15 NOTE — Telephone Encounter (Signed)
Pt called and made an appt.

## 2014-12-18 ENCOUNTER — Telehealth: Payer: Self-pay | Admitting: Family Medicine

## 2014-12-18 NOTE — Telephone Encounter (Signed)
Let me make sure I'm looking at chart correctly.  It appears I sent albuterol on 12/04/14 at her visit.  If that is the case, she shouldn't be out of her inhaler, unless I just gave a sample.   Please let me know.  Also we discussed if having to use albuterol daily, then its time to add Qvar preventative inhaler as discussed.

## 2014-12-18 NOTE — Telephone Encounter (Signed)
[facility] req ONEOKPro-air Inhaler refill

## 2014-12-18 NOTE — Telephone Encounter (Signed)
LM to CB

## 2014-12-22 NOTE — Telephone Encounter (Signed)
Called pt & she states she never picked up Rx on 4/7 and when she went to get it they said they would have to request refill.  Called pharmacy & indeed she had never picked up so I had them fill that one.

## 2014-12-26 ENCOUNTER — Other Ambulatory Visit (INDEPENDENT_AMBULATORY_CARE_PROVIDER_SITE_OTHER): Payer: PRIVATE HEALTH INSURANCE

## 2014-12-26 DIAGNOSIS — Z23 Encounter for immunization: Secondary | ICD-10-CM

## 2017-02-01 ENCOUNTER — Telehealth: Payer: Self-pay

## 2017-02-01 NOTE — Telephone Encounter (Signed)
-  Records faxed to Disability Claims per pt signed request to [id number]. Cassandra Mcmillan/RLB

## 2020-11-18 ENCOUNTER — Other Ambulatory Visit: Payer: Self-pay | Admitting: Physician Assistant

## 2020-11-18 DIAGNOSIS — Z1231 Encounter for screening mammogram for malignant neoplasm of breast: Secondary | ICD-10-CM

## 2022-07-05 ENCOUNTER — Ambulatory Visit (HOSPITAL_COMMUNITY)
Admission: EM | Admit: 2022-07-05 | Discharge: 2022-07-05 | Disposition: A | Discharge status: Home / Self Care | Payer: Managed Care, Other (non HMO) | Attending: Family Medicine | Admitting: Family Medicine

## 2022-07-05 ENCOUNTER — Encounter (HOSPITAL_COMMUNITY): Payer: Self-pay | Admitting: *Deleted

## 2022-07-05 DIAGNOSIS — N760 Acute vaginitis: Secondary | ICD-10-CM | POA: Insufficient documentation

## 2022-07-05 MED ORDER — METRONIDAZOLE 500 MG PO TABS: 500.0000 mg | ORAL_TABLET | Freq: Two times a day (BID) | ORAL | 0 refills | Status: AC

## 2022-07-05 NOTE — ED Provider Notes (Signed)
[facility]    CSN: [id number] Arrival date & time: 07/05/22  1816      History   Chief Complaint Chief Complaint  Patient presents with   Vaginal Discharge    HPI Cassandra Mcmillan is a 41 y.o. female.    Vaginal Discharge  Here for a 2-day history of watery vaginal discharge and irritation and odor.  No abdominal pain and no fever.  Last menstrual cycle was November 1.  She states it feels like when she has had BV in the past.    Past Medical History:  Diagnosis Date   History of bronchitis    Seasonal allergies     Patient Active Problem List   Diagnosis Date Noted   Trichomonas infection 06/24/2014    Past Surgical History:  Procedure Laterality Date   SHOULDER SURGERY     Left    OB History   No obstetric history on file.      Home Medications    Prior to Admission medications   Medication Sig Start Date End Date Taking? Authorizing Provider  folic acid (FOLVITE) 1 MG tablet Take 1 mg by mouth daily.   Yes [provider]  metroNIDAZOLE (FLAGYL) 500 MG tablet Take 1 tablet (500 mg total) by mouth 2 (two) times daily for 7 days. 07/05/22 07/12/22 Yes Tavyn Kurka, Janace Aris, MD  albuterol (PROVENTIL HFA;VENTOLIN HFA) 108 (90 BASE) MCG/ACT inhaler Inhale 1-2 puffs into the lungs every 6 (six) hours as needed for wheezing or shortness of breath. 12/04/14   Tysinger, Kermit Balo, PA-C  fexofenadine (ALLEGRA ALLERGY) 180 MG tablet Take 1 tablet (180 mg total) by mouth daily. 12/04/14   Tysinger, Kermit Balo, PA-C    Family History History reviewed. No pertinent family history.  Social History Social History   Tobacco Use   Smoking status: Every Day    Packs/day: 0.25    Types: Cigarettes  Substance Use Topics   Alcohol use: No   Drug use: No     Allergies   Patient has no active allergies.   Review of Systems Review of Systems  Genitourinary:  Positive for vaginal discharge.     Physical Exam Triage Vital Signs ED Triage Vitals   Enc Vitals Group     BP 07/05/22 1855 107/76     Pulse Rate 07/05/22 1855 79     Resp 07/05/22 1855 18     Temp 07/05/22 1855 99.3 F (37.4 C)     Temp Source 07/05/22 1855 Oral     SpO2 07/05/22 1855 98 %     Weight --      Height --      Head Circumference --      Peak Flow --      Pain Score 07/05/22 1853 0     Pain Loc --      Pain Edu? --      Excl. in GC? --    No data found.  Updated Vital Signs BP 107/76 (BP Location: Right Arm)   Pulse 79   Temp 99.3 F (37.4 C) (Oral)   Resp 18   LMP 06/29/2022 (Exact Date)   SpO2 98%   Visual Acuity Right Eye Distance:   Left Eye Distance:   Bilateral Distance:    Right Eye Near:   Left Eye Near:    Bilateral Near:     Physical Exam Vitals reviewed.  Constitutional:      General: She is not in acute distress.  Appearance: She is not toxic-appearing.  Cardiovascular:     Rate and Rhythm: Normal rate and regular rhythm.  Neurological:     Mental Status: She is alert and oriented to person, place, and time.  Psychiatric:        Behavior: Behavior normal.      UC Treatments / Results  Labs (all labs ordered are listed, but only abnormal results are displayed) Labs Reviewed  CERVICOVAGINAL ANCILLARY ONLY    EKG   Radiology No results found.  Procedures Procedures (including critical care time)  Medications Ordered in UC Medications - No data to display  Initial Impression / Assessment and Plan / UC Course  I have reviewed the triage vital signs and the nursing notes.  Pertinent labs & imaging results that were available during my care of the patient were reviewed by me and considered in my medical decision making (see chart for details).        Staff will notify her of any positives on the swab.  Empiric treatment is sent with metronidazole for possible BV Final Clinical Impressions(s) / UC Diagnoses   Final diagnoses:  Acute vaginitis     Discharge Instructions      Take  metronidazole 500 mg--1 tablet 2 times daily for 7 days.  Avoid drinking alcohol within 72 hours of taking this medication  Staff will notify you of any positives on the swab     ED Prescriptions     Medication Sig Dispense Auth. Provider   metroNIDAZOLE (FLAGYL) 500 MG tablet Take 1 tablet (500 mg total) by mouth 2 (two) times daily for 7 days. 14 tablet Milderd Manocchio, Gwenlyn Perking, MD      PDMP not reviewed this encounter.   Barrett Henle, MD 07/05/22 [phone]

## 2022-07-05 NOTE — Discharge Instructions (Signed)
Take metronidazole 500 mg--1 tablet 2 times daily for 7 days.  Avoid drinking alcohol within 72 hours of taking this medication  Staff will notify you of any positives on the swab

## 2022-07-05 NOTE — ED Triage Notes (Signed)
Pt states that she has some vaginal discharge and a fishy smell X 2 days. No meds are being taken.

## 2022-07-07 LAB — CERVICOVAGINAL ANCILLARY ONLY
Bacterial Vaginitis (gardnerella): POSITIVE — AB
Candida Glabrata: NEGATIVE
Candida Vaginitis: NEGATIVE
Chlamydia: NEGATIVE
Comment: NEGATIVE
Comment: NEGATIVE
Comment: NEGATIVE
Comment: NEGATIVE
Comment: NEGATIVE
Comment: NORMAL
Neisseria Gonorrhea: NEGATIVE
Trichomonas: NEGATIVE

## 2023-08-04 ENCOUNTER — Ambulatory Visit
Admission: EM | Admit: 2023-08-04 | Discharge: 2023-08-04 | Disposition: A | Discharge status: Home / Self Care | Payer: Federal, State, Local not specified - PPO | Attending: Emergency Medicine | Admitting: Emergency Medicine

## 2023-08-04 DIAGNOSIS — N898 Other specified noninflammatory disorders of vagina: Secondary | ICD-10-CM | POA: Insufficient documentation

## 2023-08-04 MED ORDER — METRONIDAZOLE 500 MG PO TABS: 500.0000 mg | ORAL_TABLET | Freq: Two times a day (BID) | ORAL | 0 refills | Status: DC

## 2023-08-04 NOTE — ED Triage Notes (Signed)
"  I am having some vaginal problems, some vaginal discharge and odor". No dysuria. No concerns for UTI. Some concern for STI (swab request only).

## 2023-08-04 NOTE — Discharge Instructions (Signed)
Discussed with patient we will send off testing which can take up to 3 days for results to return. Will go ahead and treat for possible BV Any positive results we will call patient with treatment patient should check her MyChart for results as 72 hours Stay hydrated drink plenty of water do not drink any alcohol with medication Follow-up as needed

## 2023-08-04 NOTE — ED Provider Notes (Signed)
[facility]    CSN: [id number] Arrival date & time: 08/04/23  1310      History   Chief Complaint Chief Complaint  Patient presents with   UTI Symptoms   Vaginal Discharge    HPI Cassandra Mcmillan is a 42 y.o. female.   Patient present today with vaginal discharge slight odor.  States that she has had similar symptoms last year of BV.  Patient denies any urinary symptoms and does not feel that she needs to have her urine checked.  Patient also denies wanting any blood work.  No abdominal pain no nausea vomiting or diarrhea.    Past Medical History:  Diagnosis Date   History of bronchitis    Seasonal allergies     Patient Active Problem List   Diagnosis Date Noted   Trichomonas infection 06/24/2014    Past Surgical History:  Procedure Laterality Date   SHOULDER SURGERY     Left    OB History   No obstetric history on file.      Home Medications    Prior to Admission medications   Medication Sig Start Date End Date Taking? Authorizing Provider  folic acid (FOLVITE) 1 MG tablet Take 1 mg by mouth daily.   Yes [provider]  metroNIDAZOLE (FLAGYL) 500 MG tablet Take 1 tablet (500 mg total) by mouth 2 (two) times daily. 08/04/23  Yes Coralyn Mark, NP  predniSONE (DELTASONE) 20 MG tablet Take 20 mg by mouth as directed. 09/02/17  Yes [provider]  albuterol (PROVENTIL HFA;VENTOLIN HFA) 108 (90 BASE) MCG/ACT inhaler Inhale 1-2 puffs into the lungs every 6 (six) hours as needed for wheezing or shortness of breath. 12/04/14   Tysinger, Kermit Balo, PA-C  fexofenadine (ALLEGRA ALLERGY) 180 MG tablet Take 1 tablet (180 mg total) by mouth daily. 12/04/14   Tysinger, Kermit Balo, PA-C  ibuprofen (ADVIL) 800 MG tablet Take 800 mg by mouth every 8 (eight) hours as needed.    [provider]  naproxen (NAPROSYN) 500 MG tablet Take 500 mg by mouth 2 (two) times daily with a meal.    [provider]    Family History History  reviewed. No pertinent family history.  Social History Social History   Tobacco Use   Smoking status: Former    Current packs/day: 0.25    Types: Cigarettes   Smokeless tobacco: Never  Vaping Use   Vaping status: Never Used  Substance Use Topics   Alcohol use: No   Drug use: No     Allergies   Patient has no known allergies.   Review of Systems Review of Systems  Constitutional: Negative.   Respiratory: Negative.    Cardiovascular: Negative.   Gastrointestinal: Negative.   Genitourinary:  Positive for vaginal discharge. Negative for decreased urine volume, frequency, hematuria, pelvic pain, urgency, vaginal bleeding and vaginal pain.       Foul odor     Physical Exam Triage Vital Signs ED Triage Vitals  Encounter Vitals Group     BP 08/04/23 1323 111/72     Systolic BP Percentile --      Diastolic BP Percentile --      Pulse Rate 08/04/23 1323 79     Resp 08/04/23 1323 18     Temp 08/04/23 1323 98.6 F (37 C)     Temp Source 08/04/23 1323 Oral     SpO2 08/04/23 1323 98 %     Weight 08/04/23 1321 169 lb 3.2 oz (  76.7 kg)     Height 08/04/23 1321 5\' 2"  (1.575 m)     Head Circumference --      Peak Flow --      Pain Score --      Pain Loc --      Pain Education --      Exclude from Growth Chart --    No data found.  Updated Vital Signs BP 111/72 (BP Location: Left Arm)   Pulse 79   Temp 98.6 F (37 C) (Oral)   Resp 18   Ht 5\' 2"  (1.575 m)   Wt 169 lb 3.2 oz (76.7 kg)   LMP 07/10/2023 (Exact Date)   SpO2 98%   BMI 30.95 kg/m   Visual Acuity Right Eye Distance:   Left Eye Distance:   Bilateral Distance:    Right Eye Near:   Left Eye Near:    Bilateral Near:     Physical Exam Constitutional:      Appearance: Normal appearance.  Cardiovascular:     Rate and Rhythm: Normal rate.     Pulses: Normal pulses.  Pulmonary:     Effort: Pulmonary effort is normal.  Abdominal:     General: Abdomen is flat.  Neurological:     Mental Status: She  is alert.      UC Treatments / Results  Labs (all labs ordered are listed, but only abnormal results are displayed) Labs Reviewed  CERVICOVAGINAL ANCILLARY ONLY    EKG   Radiology No results found.  Procedures Procedures (including critical care time)  Medications Ordered in UC Medications - No data to display  Initial Impression / Assessment and Plan / UC Course  I have reviewed the triage vital signs and the nursing notes.  Pertinent labs & imaging results that were available during my care of the patient were reviewed by me and considered in my medical decision making (see chart for details).     Discussed with patient we will send off testing which can take up to 3 days for results to return. Will go ahead and treat for possible BV Any positive results we will call patient with treatment patient should check her MyChart for results as 72 hours Stay hydrated drink plenty of water do not drink any alcohol with medication Follow-up as needed Final Clinical Impressions(s) / UC Diagnoses   Final diagnoses:  Vaginal discharge     Discharge Instructions      Discussed with patient we will send off testing which can take up to 3 days for results to return. Will go ahead and treat for possible BV Any positive results we will call patient with treatment patient should check her MyChart for results as 72 hours Stay hydrated drink plenty of water do not drink any alcohol with medication Follow-up as needed     ED Prescriptions     Medication Sig Dispense Auth. Provider   metroNIDAZOLE (FLAGYL) 500 MG tablet Take 1 tablet (500 mg total) by mouth 2 (two) times daily. 14 tablet Coralyn Mark, NP      PDMP not reviewed this encounter.   Coralyn Mark, NP 08/04/23 1341

## 2023-08-07 LAB — CERVICOVAGINAL ANCILLARY ONLY
Bacterial Vaginitis (gardnerella): POSITIVE — AB
Candida Glabrata: NEGATIVE
Candida Vaginitis: NEGATIVE
Chlamydia: NEGATIVE
Comment: NEGATIVE
Comment: NEGATIVE
Comment: NEGATIVE
Comment: NEGATIVE
Comment: NEGATIVE
Comment: NORMAL
Neisseria Gonorrhea: NEGATIVE
Trichomonas: NEGATIVE

## 2024-09-10 ENCOUNTER — Ambulatory Visit (HOSPITAL_COMMUNITY)
Admission: RE | Admit: 2024-09-10 | Discharge: 2024-09-10 | Disposition: A | Discharge status: Home / Self Care | Source: Ambulatory Visit | Attending: Internal Medicine | Admitting: Internal Medicine

## 2024-09-10 ENCOUNTER — Encounter (HOSPITAL_COMMUNITY): Payer: Self-pay

## 2024-09-10 VITALS — BP 114/74 | HR 82 | Temp 99.7°F | Resp 16

## 2024-09-10 DIAGNOSIS — N898 Other specified noninflammatory disorders of vagina: Secondary | ICD-10-CM | POA: Insufficient documentation

## 2024-09-10 DIAGNOSIS — Z113 Encounter for screening for infections with a predominantly sexual mode of transmission: Secondary | ICD-10-CM | POA: Insufficient documentation

## 2024-09-10 LAB — HIV ANTIBODY (ROUTINE TESTING W REFLEX): HIV Screen 4th Generation wRfx: NONREACTIVE

## 2024-09-10 MED ORDER — METRONIDAZOLE 500 MG PO TABS: 500.0000 mg | ORAL_TABLET | Freq: Two times a day (BID) | ORAL | 0 refills | Status: AC

## 2024-09-10 NOTE — ED Triage Notes (Signed)
 Pt c/o vaginal discharge with odor x2 days. Pt requesting testing for BV and STD with blood work.

## 2024-09-10 NOTE — ED Provider Notes (Signed)
 " [facility]    CSN: [id number] Arrival date & time: 09/10/24  1432      History   Chief Complaint Chief Complaint  Patient presents with   APPT - vaginal d/c    HPI Cassandra Mcmillan is a 44 y.o. female.   44 year old female presents urgent care with complaints of vaginal discharge.  This started about 2 to 3 days ago.  The discharge is thin and does have a slight odor to it.  She denies any known STI exposures.  She has had BV in the past with similar symptoms.  She denies fever, nausea, vomiting, dysuria, hematuria, abdominal pain.     Past Medical History:  Diagnosis Date   History of bronchitis    Seasonal allergies     Patient Active Problem List   Diagnosis Date Noted   Trichomonas infection 06/24/2014    Past Surgical History:  Procedure Laterality Date   SHOULDER SURGERY     Left    OB History   No obstetric history on file.      Home Medications    Prior to Admission medications  Medication Sig Start Date End Date Taking? Authorizing Provider  metroNIDAZOLE  (FLAGYL ) 500 MG tablet Take 1 tablet (500 mg total) by mouth 2 (two) times daily. 09/10/24  Yes Jacee Enerson A, PA-C  folic acid (FOLVITE) 1 MG tablet Take 1 mg by mouth daily.    [provider]  naproxen  (NAPROSYN ) 500 MG tablet Take 500 mg by mouth 2 (two) times daily with a meal.    [provider]    Family History History reviewed. No pertinent family history.  Social History Social History[1]   Allergies   Patient has no known allergies.   Review of Systems Review of Systems  Constitutional:  Negative for chills and fever.  HENT:  Negative for ear pain and sore throat.   Eyes:  Negative for pain and visual disturbance.  Respiratory:  Negative for cough and shortness of breath.   Cardiovascular:  Negative for chest pain and palpitations.  Gastrointestinal:  Negative for abdominal pain and vomiting.  Genitourinary:  Positive for vaginal  discharge. Negative for dysuria and hematuria.  Musculoskeletal:  Negative for arthralgias and back pain.  Skin:  Negative for color change and rash.  Neurological:  Negative for seizures and syncope.  All other systems reviewed and are negative.    Physical Exam Triage Vital Signs ED Triage Vitals  Encounter Vitals Group     BP 09/10/24 1459 114/74     Girls Systolic BP Percentile --      Girls Diastolic BP Percentile --      Boys Systolic BP Percentile --      Boys Diastolic BP Percentile --      Pulse Rate 09/10/24 1459 82     Resp 09/10/24 1459 16     Temp 09/10/24 1459 99.7 F (37.6 C)     Temp Source 09/10/24 1459 Oral     SpO2 09/10/24 1459 98 %     Weight --      Height --      Head Circumference --      Peak Flow --      Pain Score 09/10/24 1457 0     Pain Loc --      Pain Education --      Exclude from Growth Chart --    No data found.  Updated Vital Signs BP 114/74 (BP Location: Left Arm)  Pulse 82   Temp 99.7 F (37.6 C) (Oral)   Resp 16   LMP 08/11/2024 (Exact Date)   SpO2 98%   Visual Acuity Right Eye Distance:   Left Eye Distance:   Bilateral Distance:    Right Eye Near:   Left Eye Near:    Bilateral Near:     Physical Exam Vitals and nursing note reviewed.  Constitutional:      General: She is not in acute distress.    Appearance: She is well-developed.  HENT:     Head: Normocephalic and atraumatic.  Eyes:     Conjunctiva/sclera: Conjunctivae normal.  Cardiovascular:     Rate and Rhythm: Normal rate and regular rhythm.     Heart sounds: No murmur heard. Pulmonary:     Effort: Pulmonary effort is normal. No respiratory distress.     Breath sounds: Normal breath sounds.  Abdominal:     Palpations: Abdomen is soft.     Tenderness: There is no abdominal tenderness.  Musculoskeletal:        General: No swelling.     Cervical back: Neck supple.  Skin:    General: Skin is warm and dry.     Capillary Refill: Capillary refill takes  less than 2 seconds.  Neurological:     Mental Status: She is alert.  Psychiatric:        Mood and Affect: Mood normal.      UC Treatments / Results  Labs (all labs ordered are listed, but only abnormal results are displayed) Labs Reviewed  SYPHILIS: RPR W/REFLEX TO RPR TITER AND TREPONEMAL ANTIBODIES, TRADITIONAL SCREENING AND DIAGNOSIS ALGORITHM  HIV ANTIBODY (ROUTINE TESTING W REFLEX)  CERVICOVAGINAL ANCILLARY ONLY    EKG   Radiology No results found.  Procedures Procedures (including critical care time)  Medications Ordered in UC Medications - No data to display  Initial Impression / Assessment and Plan / UC Course  I have reviewed the triage vital signs and the nursing notes.  Pertinent labs & imaging results that were available during my care of the patient were reviewed by me and considered in my medical decision making (see chart for details).     Vaginal discharge  Screening examination for STI   We are treating you for bacterial vaginosis.  Please take metronidazole  twice daily for 7 days.  Do not drink any alcohol with this medication for 3 days after completing course as it will cause you to vomit.  We will contact you if we need to arrange additional treatment based on your testing. Abstain from sex until you receive your final results.  Use a condom for the sexual encounter.  Use hypoallergenic soaps and detergents and wear loosefitting cotton underwear.  If you have any worsening or changing symptoms including abnormal discharge, pelvic pain, abdominal pain, fever, nausea, vomiting you should be reevaluated.   Final Clinical Impressions(s) / UC Diagnoses   Final diagnoses:  Vaginal discharge  Screening examination for STI     Discharge Instructions      We are treating you for bacterial vaginosis.  Please take metronidazole  twice daily for 7 days.  Do not drink any alcohol with this medication for 3 days after completing course as it will cause you  to vomit.  We will contact you if we need to arrange additional treatment based on your testing. Abstain from sex until you receive your final results.  Use a condom for the sexual encounter.  Use hypoallergenic soaps and detergents and  wear loosefitting cotton underwear.  If you have any worsening or changing symptoms including abnormal discharge, pelvic pain, abdominal pain, fever, nausea, vomiting you should be reevaluated.     ED Prescriptions     Medication Sig Dispense Auth. Provider   metroNIDAZOLE  (FLAGYL ) 500 MG tablet Take 1 tablet (500 mg total) by mouth 2 (two) times daily. 14 tablet Teresa Almarie LABOR, PA-C      PDMP not reviewed this encounter.    [1]  Social History Tobacco Use   Smoking status: Former    Current packs/day: 0.25    Types: Cigarettes   Smokeless tobacco: Never  Vaping Use   Vaping status: Never Used  Substance Use Topics   Alcohol use: No   Drug use: No     Teresa Almarie LABOR, PA-C 09/10/24 1510  "

## 2024-09-10 NOTE — Discharge Instructions (Signed)
We are treating you for bacterial vaginosis.  Please take metronidazole twice daily for 7 days.  Do not drink any alcohol with this medication for 3 days after completing course as it will cause you to vomit.  We will contact you if we need to arrange additional treatment based on your testing. Abstain from sex until you receive your final results.  Use a condom for the sexual encounter.  Use hypoallergenic soaps and detergents and wear loosefitting cotton underwear.  If you have any worsening or changing symptoms including abnormal discharge, pelvic pain, abdominal pain, fever, nausea, vomiting you should be reevaluated.

## 2024-09-11 ENCOUNTER — Ambulatory Visit (HOSPITAL_COMMUNITY): Payer: Self-pay

## 2024-09-11 LAB — CERVICOVAGINAL ANCILLARY ONLY
Bacterial Vaginitis (gardnerella): POSITIVE — AB
Candida Glabrata: NEGATIVE
Candida Vaginitis: POSITIVE — AB
Chlamydia: NEGATIVE
Comment: NEGATIVE
Comment: NEGATIVE
Comment: NEGATIVE
Comment: NEGATIVE
Comment: NEGATIVE
Comment: NORMAL
Neisseria Gonorrhea: NEGATIVE
Trichomonas: NEGATIVE

## 2024-09-11 LAB — SYPHILIS: RPR W/REFLEX TO RPR TITER AND TREPONEMAL ANTIBODIES, TRADITIONAL SCREENING AND DIAGNOSIS ALGORITHM: RPR Ser Ql: NONREACTIVE

## 2024-09-11 MED ORDER — FLUCONAZOLE 150 MG PO TABS: 150.0000 mg | ORAL_TABLET | Freq: Once | ORAL | 0 refills | Status: AC
